# Patient Record
Sex: Male | Born: 1980 | Race: White | Hispanic: No | Marital: Single | Smoking: Never smoker
Health system: Southern US, Community
[De-identification: ages and names within clinical notes are randomized; demographics above are authoritative.]

---

## 2007-05-07 ENCOUNTER — Emergency Department (HOSPITAL_COMMUNITY): Admission: EM | Admit: 2007-05-07 | Discharge: 2007-05-07 | Payer: Self-pay | Admitting: Family Medicine

## 2014-08-09 ENCOUNTER — Emergency Department (HOSPITAL_COMMUNITY): Payer: Self-pay

## 2014-08-09 ENCOUNTER — Observation Stay (HOSPITAL_COMMUNITY)
Admission: EM | Admit: 2014-08-09 | Discharge: 2014-08-12 | Disposition: A | Discharge status: Home / Self Care | Payer: Self-pay | Attending: General Surgery | Admitting: General Surgery

## 2014-08-09 ENCOUNTER — Encounter (HOSPITAL_COMMUNITY): Payer: Self-pay | Admitting: *Deleted

## 2014-08-09 DIAGNOSIS — K37 Unspecified appendicitis: Secondary | ICD-10-CM | POA: Diagnosis present

## 2014-08-09 DIAGNOSIS — K3580 Unspecified acute appendicitis: Principal | ICD-10-CM | POA: Insufficient documentation

## 2014-08-09 DIAGNOSIS — K219 Gastro-esophageal reflux disease without esophagitis: Secondary | ICD-10-CM | POA: Insufficient documentation

## 2014-08-09 LAB — COMPREHENSIVE METABOLIC PANEL
ALK PHOS: 88 U/L (ref 39–117)
ALT: 32 U/L (ref 0–53)
AST: 23 U/L (ref 0–37)
Albumin: 4.4 g/dL (ref 3.5–5.2)
Anion gap: 9 (ref 5–15)
BUN: 11 mg/dL (ref 6–23)
CHLORIDE: 103 mmol/L (ref 96–112)
CO2: 25 mmol/L (ref 19–32)
Calcium: 9.7 mg/dL (ref 8.4–10.5)
Creatinine, Ser: 0.99 mg/dL (ref 0.50–1.35)
GFR calc Af Amer: >90 mL/min (ref >90)
GLUCOSE: 109 mg/dL — AB (ref 70–99)
Potassium: 4.2 mmol/L (ref 3.5–5.1)
SODIUM: 137 mmol/L (ref 135–145)
Total Bilirubin: 0.6 mg/dL (ref 0.3–1.2)
Total Protein: 7.1 g/dL (ref 6.0–8.3)

## 2014-08-09 LAB — CBC WITH DIFFERENTIAL/PLATELET
Basophils Absolute: 0 10*3/uL (ref 0.0–0.1)
Basophils Relative: 0 % (ref 0–1)
EOS ABS: 0 10*3/uL (ref 0.0–0.7)
Eosinophils Relative: 0 % (ref 0–5)
HEMATOCRIT: 43.3 % (ref 39.0–52.0)
HEMOGLOBIN: 15.4 g/dL (ref 13.0–17.0)
LYMPHS PCT: 12 % (ref 12–46)
Lymphs Abs: 1.9 10*3/uL (ref 0.7–4.0)
MCH: 31.7 pg (ref 26.0–34.0)
MCHC: 35.6 g/dL (ref 30.0–36.0)
MCV: 89.1 fL (ref 78.0–100.0)
MONO ABS: 0.5 10*3/uL (ref 0.1–1.0)
MONOS PCT: 3 % (ref 3–12)
Neutro Abs: 13.1 10*3/uL — ABNORMAL HIGH (ref 1.7–7.7)
Neutrophils Relative %: 85 % — ABNORMAL HIGH (ref 43–77)
Platelets: 322 10*3/uL (ref 150–400)
RBC: 4.86 MIL/uL (ref 4.22–5.81)
RDW: 12.5 % (ref 11.5–15.5)
WBC: 15.5 10*3/uL — AB (ref 4.0–10.5)

## 2014-08-09 LAB — LIPASE, BLOOD: Lipase: 29 U/L (ref 11–59)

## 2014-08-09 MED ORDER — IOHEXOL 300 MG/ML  SOLN
25.0000 mL | Freq: Once | INTRAMUSCULAR | Status: AC | PRN
  Administered 2014-08-09: 25 mL via ORAL

## 2014-08-09 MED ORDER — ONDANSETRON 4 MG PO TBDP
8.0000 mg | ORAL_TABLET | Freq: Once | ORAL | Status: AC
  Administered 2014-08-09: 8 mg via ORAL

## 2014-08-09 MED ORDER — ONDANSETRON 4 MG PO TBDP
ORAL_TABLET | ORAL | Status: AC
  Administered 2014-08-09: 22:00:00
  Filled 2014-08-09: qty 2

## 2014-08-09 MED ORDER — IOHEXOL 300 MG/ML  SOLN
100.0000 mL | Freq: Once | INTRAMUSCULAR | Status: AC | PRN
  Administered 2014-08-09: 100 mL via INTRAVENOUS

## 2014-08-09 MED ORDER — SODIUM CHLORIDE 0.9 % IV SOLN
3.0000 g | Freq: Once | INTRAVENOUS | Status: AC
  Administered 2014-08-09: 3 g via INTRAVENOUS
  Filled 2014-08-09: qty 3

## 2014-08-09 MED ORDER — OXYCODONE-ACETAMINOPHEN 5-325 MG PO TABS
1.0000 | ORAL_TABLET | Freq: Once | ORAL | Status: DC
Start: 2014-08-09 — End: 2014-08-10

## 2014-08-09 MED ORDER — PANTOPRAZOLE SODIUM 40 MG IV SOLR
40.0000 mg | Freq: Once | INTRAVENOUS | Status: AC
  Administered 2014-08-09: 40 mg via INTRAVENOUS
  Filled 2014-08-09: qty 40

## 2014-08-09 MED ORDER — ONDANSETRON HCL 4 MG/2ML IJ SOLN
4.0000 mg | Freq: Once | INTRAMUSCULAR | Status: AC
  Administered 2014-08-09: 4 mg via INTRAVENOUS
  Filled 2014-08-09: qty 2

## 2014-08-09 MED ORDER — HYDROMORPHONE HCL 1 MG/ML IJ SOLN
1.0000 mg | Freq: Once | INTRAMUSCULAR | Status: AC
  Administered 2014-08-09: 1 mg via INTRAVENOUS
  Filled 2014-08-09: qty 1

## 2014-08-09 MED ORDER — SODIUM CHLORIDE 0.9 % IV BOLUS (SEPSIS)
1000.0000 mL | Freq: Once | INTRAVENOUS | Status: AC
  Administered 2014-08-09: 1000 mL via INTRAVENOUS

## 2014-08-09 NOTE — ED Provider Notes (Signed)
This chart was scribed for Jeremiah DredgeEmily Tamanna Whitson, PA-C with Benjiman CoreNathan Pickering, MD by Tonye RoyaltyJoshua Chen, ED Scribe. This patient was seen in room TR01C/TR01C and the patient's care was started at 6:09 PM.   Patient seen in Pod F in attempt to order appropriate studies in order to expedite her care once she is seen in the Main ED.   Jeremiah ManisStephen Campanile is a 34 y.o. male who presents to the Emergency Department complaining of abdominal pain with onset this morning, worsening progressively. He locates it to epigastrium and describes it as "like a hot poker." He rates it at 10/10. He denies having similar symptoms previously. He denies injury. He reports associated vomiting x 2 and one episode of what may have been diarrhea.  He denies history of pancreatitis or gallbladder problem. He reports prior kidney stones and GERD.  He denies urinary symptoms but notes he has not urinated since this morning. Denies CP, SOB, cough.  He agrees with treatment plan, including CT of abdomen. Other workup and evaluation deferred to provider in Main ED. Pt to be moved back to the waiting room to await room available in main ED.    I personally performed the services described in this documentation, which was scribed in my presence. The recorded information has been reviewed and is accurate.   Jeremiah Dredgemily Anaika Santillano, PA-C 08/09/14 1855  Benjiman CoreNathan Pickering, MD 08/12/14 1250

## 2014-08-09 NOTE — H&P (Signed)
Chief Complaint:  Abdominal pain since this morning with early acute appendicitis on CT  History of Present Illness:  Jeremiah [name] is an 34 y.o. 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  He had midepigastric abdominal pain with nausea and vomiting and on CT scan has early appendicitis.  Seen in the ER and procedure explained and he will be admitted for IV antibiotics  History reviewed. No pertinent past medical history.  History reviewed. No pertinent past surgical history.  Current Facility-Administered Medications  Medication Dose Route Frequency Provider Last Rate Last Dose  . Ampicillin-Sulbactam (UNASYN) 3 g in sodium chloride 0.9 % 100 mL IVPB  3 g Intravenous Once Lajean Saver, MD 100 mL/hr at 08/09/14 2159 3 g at 08/09/14 2159  . oxyCODONE-acetaminophen (PERCOCET/ROXICET) 5-325 MG per tablet 1 tablet  1 tablet Oral Once Lolita, PA-C   Stopped at 08/09/14 1940   Current Outpatient Prescriptions  Medication Sig Dispense Refill  . Aspirin-Acetaminophen-Caffeine (GOODY HEADACHE PO) Take 1 Package by mouth every 6 (six) hours as needed (for pain).    Marland Kitchen omeprazole (PRILOSEC) 20 MG capsule Take 20 mg by mouth daily.     Bee venom History reviewed. No pertinent family history. Social History:   reports that he has never smoked. He has never used smokeless tobacco. He reports that he does not drink alcohol or use illicit drugs.   REVIEW OF SYSTEMS : Negative   Physical Exam:   Blood pressure 134/81, pulse 107, temperature 98 F (36.7 C), temperature source Oral, resp. rate 14, SpO2 97 %. There is no height or weight on file to calculate BMI.  Gen:  WDWN WM NAD -pain meds on board Neurological: Alert and oriented to person, place, and time. Motor and sensory function is grossly intact  Head: Normocephalic and atraumatic.  Eyes: Conjunctivae are normal. Pupils are equal, round, and reactive to light. No scleral icterus.  Neck: Normal range of  motion. Neck supple. No tracheal deviation or thyromegaly present.  Cardiovascular:  SR without murmurs or gallops.  No carotid bruits Breast:  Not examined Respiratory: Effort normal.  No respiratory distress. No chest wall tenderness. Breath sounds normal.  No wheezes, rales or rhonchi.  Abdomen:  Tender in the midepigastrium and right lower quadrantr GU:  unremarkable Musculoskeletal: Normal range of motion. Extremities are nontender. No cyanosis, edema or clubbing noted Lymphadenopathy: No cervical, preauricular, postauricular or axillary adenopathy is present Skin: Skin is warm and dry. No rash noted. No diaphoresis. No erythema. No pallor. Pscyh: Normal mood and affect. Behavior is normal. Judgment and thought content normal.   LABORATORY RESULTS: Results for orders placed or performed during the hospital encounter of 08/09/14 (from the past 48 hour(s))  CBC with Differential     Status: Abnormal   Collection Time: 08/09/14  4:50 PM  Result Value Ref Range   WBC 15.5 (H) 4.0 - 10.5 K/uL   RBC 4.86 4.22 - 5.81 MIL/uL   Hemoglobin 15.4 13.0 - 17.0 g/dL   HCT 43.3 39.0 - 52.0 %   MCV 89.1 78.0 - 100.0 fL   MCH 31.7 26.0 - 34.0 pg   MCHC 35.6 30.0 - 36.0 g/dL   RDW 12.5 11.5 - 15.5 %   Platelets 322 150 - 400 K/uL   Neutrophils Relative % 85 (H) 43 - 77 %   Neutro Abs 13.1 (H) 1.7 - 7.7 K/uL   Lymphocytes Relative 12 12 - 46 %   Lymphs Abs 1.9  0.7 - 4.0 K/uL   Monocytes Relative 3 3 - 12 %   Monocytes Absolute 0.5 0.1 - 1.0 K/uL   Eosinophils Relative 0 0 - 5 %   Eosinophils Absolute 0.0 0.0 - 0.7 K/uL   Basophils Relative 0 0 - 1 %   Basophils Absolute 0.0 0.0 - 0.1 K/uL  Comprehensive metabolic panel     Status: Abnormal   Collection Time: 08/09/14  4:50 PM  Result Value Ref Range   Sodium 137 135 - 145 mmol/L   Potassium 4.2 3.5 - 5.1 mmol/L   Chloride 103 96 - 112 mmol/L   CO2 25 19 - 32 mmol/L   Glucose, Bld 109 (H) 70 - 99 mg/dL   BUN 11 6 - 23 mg/dL   Creatinine,  Ser 0.99 0.50 - 1.35 mg/dL   Calcium 9.7 8.4 - 10.5 mg/dL   Total Protein 7.1 6.0 - 8.3 g/dL   Albumin 4.4 3.5 - 5.2 g/dL   AST 23 0 - 37 U/L   ALT 32 0 - 53 U/L   Alkaline Phosphatase 88 39 - 117 U/L   Total Bilirubin 0.6 0.3 - 1.2 mg/dL   GFR calc non Af Amer >90 >90 mL/min   GFR calc Af Amer >90 >90 mL/min    Comment: (NOTE) The eGFR has been calculated using the CKD EPI equation. This calculation has not been validated in all clinical situations. eGFR's persistently <90 mL/min signify possible Chronic Kidney Disease.    Anion gap 9 5 - 15  Lipase, blood     Status: None   Collection Time: 08/09/14  4:50 PM  Result Value Ref Range   Lipase 29 11 - 59 U/L     RADIOLOGY RESULTS: Ct Abdomen Pelvis W Contrast  08/09/2014   CLINICAL DATA:  Epigastric pain with nausea. Symptoms since this morning, progressive.  EXAM: CT ABDOMEN AND PELVIS WITH CONTRAST  TECHNIQUE: Multidetector CT imaging of the abdomen and pelvis was performed using the standard protocol following bolus administration of intravenous contrast.  CONTRAST:  145m OMNIPAQUE IOHEXOL 300 MG/ML  SOLN  COMPARISON:  None.  FINDINGS: The included lung bases are clear.  The appendix is abnormal measuring 14 mm proximally and contains intraluminal debris. There is air in the midportion of the appendix. Distally the appendix is fluid-filled and measures 10 mm. There is no definite surrounding periappendiceal inflammatory change. Findings are suspicious for very early acute appendicitis. There is no perforation or abscess.  The liver, gallbladder, spleen, adrenal glands, and pancreas are normal. Small splenule noted at the splenic hilum. There is a small cyst in the right kidney measuring 7 mm. Kidneys are otherwise normal. There is symmetric renal enhancement without hydronephrosis or perinephric stranding.  The stomach is physiologically distended. There are no dilated or thickened bowel loops. The terminal ileum is normal. Small volume  of stool in the proximal colon, distal colon is decompressed. There is no pericolonic inflammatory change.  Abdominal aorta is normal in caliber. No retroperitoneal adenopathy.  Within the pelvis the bladder is physiologically distended. Prostate gland is normal. There is no pelvic adenopathy. No pelvic free fluid. Minimal fat in the right inguinal canal.  There are no acute or suspicious osseous abnormalities.  IMPRESSION: Dilated proximal appendix (123m containing intraluminal debris, with fluid distending the distal appendix. While there is no surrounding inflammatory change, findings are suspicious for very early acute appendicitis.   Electronically Signed   By: MeJeb Levering.D.   On: 08/09/2014 21:26  Problem List: Patient Active Problem List   Diagnosis Date Noted  . Appendicitis, acute 08/09/2014    Assessment & Plan: Acute appendicitis--Start Zosyn and plan lap appy in am    Matt B. Hassell Done, MD, [facility] [phone] beeper [phone]  08/09/2014 10:55 PM

## 2014-08-09 NOTE — ED Notes (Signed)
Pt started c/o epigastric pain and nausea again, MD to be notified.

## 2014-08-09 NOTE — ED Provider Notes (Signed)
CSN: [id number]     Arrival date & time 08/09/14  1637 History   First MD Initiated Contact with Patient 08/09/14 1802     Chief Complaint  Patient presents with  . Abdominal Pain     (Consider location/radiation/quality/duration/timing/severity/associated sxs/prior Treatment) Patient is a 34 y.o. male presenting with abdominal pain. The history is provided by the patient.  Abdominal Pain Associated symptoms: nausea and vomiting   Associated symptoms: no chest pain, no chills, no constipation, no cough, no diarrhea, no dysuria, no fever, no hematuria, no shortness of breath and no sore throat   pt c/o epigastric and mid abd pain onset this morning. Pain constant, dull, mod-severe, non radiating. No specific exacerbating or alleviating factors. No fever or chills. +n/v x 2, emesis not bloody or bilious. Had normal bm today. No abd distension. No prior abd surgery. No hx similar pain. No hx pud, gallstones or pancreatitis. No dysuria, hematuria or gu c/o. No back or flank pain. No scrotal or testicular pain. No cough or uri c/o. No abd trauma.      History reviewed. No pertinent past medical history. History reviewed. No pertinent past surgical history. History reviewed. No pertinent family history. History  Substance Use Topics  . Smoking status: Never Smoker   . Smokeless tobacco: Never Used  . Alcohol Use: No    Review of Systems  Constitutional: Negative for fever and chills.  HENT: Negative for sore throat.   Eyes: Negative for redness.  Respiratory: Negative for cough and shortness of breath.   Cardiovascular: Negative for chest pain.  Gastrointestinal: Positive for nausea, vomiting and abdominal pain. Negative for diarrhea and constipation.  Endocrine: Negative for polyuria.  Genitourinary: Negative for dysuria, hematuria and flank pain.  Musculoskeletal: Negative for back pain and neck pain.  Skin: Negative for rash.  Neurological: Negative for headaches.   Hematological: Does not bruise/bleed easily.  Psychiatric/Behavioral: Negative for confusion.      Allergies  Review of patient's allergies indicates no known allergies.  Home Medications   Prior to Admission medications   Not on File   BP 143/92 mmHg  Pulse 79  Temp(Src) 98 F (36.7 C) (Oral)  Resp 20  SpO2 100% Physical Exam  Constitutional: He is oriented to person, place, and time. He appears well-developed and well-nourished. No distress.  HENT:  Head: Atraumatic.  Eyes: Pupils are equal, round, and reactive to light. No scleral icterus.  Neck: Neck supple. No tracheal deviation present.  Cardiovascular: Normal rate, regular rhythm, normal heart sounds and intact distal pulses.  Exam reveals no gallop and no friction rub.   No murmur heard. Pulmonary/Chest: Effort normal and breath sounds normal. No accessory muscle usage. No respiratory distress.  Abdominal: Soft. Bowel sounds are normal. He exhibits no distension and no mass. There is tenderness. There is no rebound and no guarding.  Moderate mid abd tenderness. No hernia.   Genitourinary:  No cva tenderness  Musculoskeletal: Normal range of motion.  Neurological: He is alert and oriented to person, place, and time.  Skin: Skin is warm and dry. No rash noted.  Psychiatric: He has a normal mood and affect.  Nursing note and vitals reviewed.   ED Course  Procedures (including critical care time) Labs Review  Results for orders placed or performed during the hospital encounter of 08/09/14  CBC with Differential  Result Value Ref Range   WBC 15.5 (H) 4.0 - 10.5 K/uL   RBC 4.86 4.22 - 5.81 MIL/uL   Hemoglobin  15.4 13.0 - 17.0 g/dL   HCT 16.1 09.6 - 04.5 %   MCV 89.1 78.0 - 100.0 fL   MCH 31.7 26.0 - 34.0 pg   MCHC 35.6 30.0 - 36.0 g/dL   RDW 40.9 81.1 - 91.4 %   Platelets 322 150 - 400 K/uL   Neutrophils Relative % 85 (H) 43 - 77 %   Neutro Abs 13.1 (H) 1.7 - 7.7 K/uL   Lymphocytes Relative 12 12 - 46 %    Lymphs Abs 1.9 0.7 - 4.0 K/uL   Monocytes Relative 3 3 - 12 %   Monocytes Absolute 0.5 0.1 - 1.0 K/uL   Eosinophils Relative 0 0 - 5 %   Eosinophils Absolute 0.0 0.0 - 0.7 K/uL   Basophils Relative 0 0 - 1 %   Basophils Absolute 0.0 0.0 - 0.1 K/uL  Comprehensive metabolic panel  Result Value Ref Range   Sodium 137 135 - 145 mmol/L   Potassium 4.2 3.5 - 5.1 mmol/L   Chloride 103 96 - 112 mmol/L   CO2 25 19 - 32 mmol/L   Glucose, Bld 109 (H) 70 - 99 mg/dL   BUN 11 6 - 23 mg/dL   Creatinine, Ser 7.82 0.50 - 1.35 mg/dL   Calcium 9.7 8.4 - 95.6 mg/dL   Total Protein 7.1 6.0 - 8.3 g/dL   Albumin 4.4 3.5 - 5.2 g/dL   AST 23 0 - 37 U/L   ALT 32 0 - 53 U/L   Alkaline Phosphatase 88 39 - 117 U/L   Total Bilirubin 0.6 0.3 - 1.2 mg/dL   GFR calc non Af Amer >90 >90 mL/min   GFR calc Af Amer >90 >90 mL/min   Anion gap 9 5 - 15  Lipase, blood  Result Value Ref Range   Lipase 29 11 - 59 U/L   Ct Abdomen Pelvis W Contrast  08/09/2014   CLINICAL DATA:  Epigastric pain with nausea. Symptoms since this morning, progressive.  EXAM: CT ABDOMEN AND PELVIS WITH CONTRAST  TECHNIQUE: Multidetector CT imaging of the abdomen and pelvis was performed using the standard protocol following bolus administration of intravenous contrast.  CONTRAST:  OMNIPAQUE IOHEXOL 300 MG/ML  SOLN  COMPARISON:  None.  FINDINGS: The included lung bases are clear.  The appendix is abnormal measuring 14 mm proximally and contains intraluminal debris. There is air in the midportion of the appendix. Distally the appendix is fluid-filled and measures 10 mm. There is no definite surrounding periappendiceal inflammatory change. Findings are suspicious for very early acute appendicitis. There is no perforation or abscess.  The liver, gallbladder, spleen, adrenal glands, and pancreas are normal. Small splenule noted at the splenic hilum. There is a small cyst in the right kidney measuring 7 mm. Kidneys are otherwise normal. There is  symmetric renal enhancement without hydronephrosis or perinephric stranding.  The stomach is physiologically distended. There are no dilated or thickened bowel loops. The terminal ileum is normal. Small volume of stool in the proximal colon, distal colon is decompressed. There is no pericolonic inflammatory change.  Abdominal aorta is normal in caliber. No retroperitoneal adenopathy.  Within the pelvis the bladder is physiologically distended. Prostate gland is normal. There is no pelvic adenopathy. No pelvic free fluid. Minimal fat in the right inguinal canal.  There are no acute or suspicious osseous abnormalities.  IMPRESSION: Dilated proximal appendix (14mm) containing intraluminal debris, with fluid distending the distal appendix. While there is no surrounding inflammatory change, findings are suspicious  for very early acute appendicitis.   Electronically Signed   By: Rubye OaksMelanie  Ehinger M.D.   On: 08/09/2014 21:26       MDM   Iv ns bolus.   Labs. Ct.  Dilaudid 1 mg iv. zofran iv. protonix iv.  Reviewed nursing notes and prior charts for additional history.   2130 ct result noted - call placed to general surgeon - discussed w gen surgeon on call, who is in OR currently - he will see as soon as done.   unasyn iv.   Recheck pt more comfortable, informed of ct.   Recheck abd mid to right lower abd pain, no rebound/guarding.  Awaiting surgery/OR.     Cathren LaineKevin Elio Haden, MD 08/09/14 2139

## 2014-08-09 NOTE — ED Notes (Signed)
Pt reports mid abdominal pain since this morning associated with n/v.  Pt reports normal BM today, denies any urinary symptoms. Pt reports some relief with vomiting. Pt denies being around any body sick.

## 2014-08-09 NOTE — ED Notes (Signed)
Patient transported to CT 

## 2014-08-10 ENCOUNTER — Observation Stay (HOSPITAL_COMMUNITY): Payer: MEDICAID | Admitting: Anesthesiology

## 2014-08-10 ENCOUNTER — Encounter (HOSPITAL_COMMUNITY)
Admission: EM | Disposition: A | Discharge status: Home / Self Care | Payer: Self-pay | Source: Home / Self Care | Attending: Emergency Medicine

## 2014-08-10 ENCOUNTER — Observation Stay (HOSPITAL_COMMUNITY): Payer: Self-pay | Admitting: Anesthesiology

## 2014-08-10 ENCOUNTER — Encounter (HOSPITAL_COMMUNITY): Payer: Self-pay | Admitting: Certified Registered Nurse Anesthetist

## 2014-08-10 HISTORY — PX: LAPAROSCOPIC APPENDECTOMY: SHX408

## 2014-08-10 LAB — CBC
HCT: 40.3 % (ref 39.0–52.0)
HEMATOCRIT: 40.9 % (ref 39.0–52.0)
Hemoglobin: 14 g/dL (ref 13.0–17.0)
Hemoglobin: 14 g/dL (ref 13.0–17.0)
MCH: 31.3 pg (ref 26.0–34.0)
MCH: 30.8 pg (ref 26.0–34.0)
MCHC: 34.2 g/dL (ref 30.0–36.0)
MCHC: 34.7 g/dL (ref 30.0–36.0)
MCV: 90.1 fL (ref 78.0–100.0)
MCV: 90.2 fL (ref 78.0–100.0)
PLATELETS: 286 10*3/uL (ref 150–400)
Platelets: 293 10*3/uL (ref 150–400)
RBC: 4.47 MIL/uL (ref 4.22–5.81)
RBC: 4.54 MIL/uL (ref 4.22–5.81)
RDW: 12.6 % (ref 11.5–15.5)
RDW: 12.6 % (ref 11.5–15.5)
WBC: 20.9 10*3/uL — ABNORMAL HIGH (ref 4.0–10.5)
WBC: 21 10*3/uL — ABNORMAL HIGH (ref 4.0–10.5)

## 2014-08-10 LAB — CREATININE, SERUM
Creatinine, Ser: 1.03 mg/dL (ref 0.50–1.35)
GFR calc Af Amer: >90 mL/min (ref >90)
GFR calc non Af Amer: >90 mL/min (ref >90)

## 2014-08-10 LAB — SURGICAL PCR SCREEN
MRSA, PCR: NEGATIVE
STAPHYLOCOCCUS AUREUS: NEGATIVE

## 2014-08-10 SURGERY — APPENDECTOMY, LAPAROSCOPIC
Anesthesia: General | Site: Abdomen

## 2014-08-10 MED ORDER — OXYCODONE-ACETAMINOPHEN 5-325 MG PO TABS
1.0000 | ORAL_TABLET | ORAL | Status: DC | PRN
Start: 2014-08-10 — End: 2014-08-12
  Administered 2014-08-11 – 2014-08-12 (×4): 2 via ORAL
  Filled 2014-08-10 (×5): qty 2

## 2014-08-10 MED ORDER — BUPIVACAINE-EPINEPHRINE (PF) 0.25% -1:200000 IJ SOLN
INTRAMUSCULAR | Status: AC
  Filled 2014-08-10: qty 30

## 2014-08-10 MED ORDER — MORPHINE SULFATE 2 MG/ML IJ SOLN
1.0000 mg | INTRAMUSCULAR | Status: DC | PRN
  Administered 2014-08-10 (×4): 1 mg via INTRAVENOUS
  Filled 2014-08-10 (×4): qty 1

## 2014-08-10 MED ORDER — LIDOCAINE HCL (CARDIAC) 20 MG/ML IV SOLN
INTRAVENOUS | Status: AC
  Filled 2014-08-10: qty 5

## 2014-08-10 MED ORDER — OXYCODONE-ACETAMINOPHEN 5-325 MG PO TABS: 1.0000 | ORAL_TABLET | ORAL | Status: DC | PRN

## 2014-08-10 MED ORDER — 0.9 % SODIUM CHLORIDE (POUR BTL) OPTIME
TOPICAL | Status: DC | PRN
  Administered 2014-08-10: 1000 mL

## 2014-08-10 MED ORDER — KETOROLAC TROMETHAMINE 30 MG/ML IJ SOLN
30.0000 mg | Freq: Once | INTRAMUSCULAR | Status: AC | PRN
  Administered 2014-08-10: 30 mg via INTRAVENOUS

## 2014-08-10 MED ORDER — LACTATED RINGERS IV SOLN
INTRAVENOUS | Status: DC | PRN
  Administered 2014-08-10 (×2): via INTRAVENOUS

## 2014-08-10 MED ORDER — SUCCINYLCHOLINE CHLORIDE 20 MG/ML IJ SOLN
INTRAMUSCULAR | Status: DC | PRN
  Administered 2014-08-10: 120 mg via INTRAVENOUS

## 2014-08-10 MED ORDER — ONDANSETRON HCL 4 MG/2ML IJ SOLN
INTRAMUSCULAR | Status: DC | PRN
  Administered 2014-08-10: 4 mg via INTRAVENOUS

## 2014-08-10 MED ORDER — DIPHENHYDRAMINE HCL 12.5 MG/5ML PO ELIX: 12.5000 mg | ORAL_SOLUTION | Freq: Four times a day (QID) | ORAL | Status: DC | PRN

## 2014-08-10 MED ORDER — SUCCINYLCHOLINE CHLORIDE 20 MG/ML IJ SOLN
INTRAMUSCULAR | Status: AC
  Filled 2014-08-10: qty 1

## 2014-08-10 MED ORDER — PROMETHAZINE HCL 25 MG/ML IJ SOLN: 6.2500 mg | INTRAMUSCULAR | Status: DC | PRN

## 2014-08-10 MED ORDER — GLYCOPYRROLATE 0.2 MG/ML IJ SOLN
INTRAMUSCULAR | Status: AC
  Filled 2014-08-10: qty 3

## 2014-08-10 MED ORDER — ROCURONIUM BROMIDE 50 MG/5ML IV SOLN
INTRAVENOUS | Status: AC
  Filled 2014-08-10: qty 1

## 2014-08-10 MED ORDER — MIDAZOLAM HCL 2 MG/2ML IJ SOLN
INTRAMUSCULAR | Status: AC
Start: 2014-08-10 — End: 2014-08-10
  Filled 2014-08-10: qty 2

## 2014-08-10 MED ORDER — KETOROLAC TROMETHAMINE 30 MG/ML IJ SOLN
INTRAMUSCULAR | Status: AC
  Filled 2014-08-10: qty 1

## 2014-08-10 MED ORDER — ALUM & MAG HYDROXIDE-SIMETH 200-200-20 MG/5ML PO SUSP
30.0000 mL | Freq: Four times a day (QID) | ORAL | Status: DC | PRN
  Administered 2014-08-11 (×2): 30 mL via ORAL
  Filled 2014-08-10 (×3): qty 30

## 2014-08-10 MED ORDER — ROCURONIUM BROMIDE 100 MG/10ML IV SOLN
INTRAVENOUS | Status: DC | PRN
  Administered 2014-08-10: 40 mg via INTRAVENOUS

## 2014-08-10 MED ORDER — MIDAZOLAM HCL 5 MG/5ML IJ SOLN
INTRAMUSCULAR | Status: DC | PRN
  Administered 2014-08-10: 2 mg via INTRAVENOUS

## 2014-08-10 MED ORDER — PIPERACILLIN-TAZOBACTAM 3.375 G IVPB
3.3750 g | Freq: Three times a day (TID) | INTRAVENOUS | Status: DC
  Administered 2014-08-10 – 2014-08-11 (×4): 3.375 g via INTRAVENOUS
  Filled 2014-08-10 (×6): qty 50

## 2014-08-10 MED ORDER — PANTOPRAZOLE SODIUM 40 MG PO TBEC
40.0000 mg | DELAYED_RELEASE_TABLET | Freq: Every day | ORAL | Status: DC
  Administered 2014-08-11 – 2014-08-12 (×3): 40 mg via ORAL
  Filled 2014-08-10 (×3): qty 1

## 2014-08-10 MED ORDER — LACTATED RINGERS IV SOLN
INTRAVENOUS | Status: DC
  Administered 2014-08-10: 50 mL/h via INTRAVENOUS
  Administered 2014-08-11: 02:00:00 via INTRAVENOUS

## 2014-08-10 MED ORDER — LIDOCAINE HCL (CARDIAC) 20 MG/ML IV SOLN
INTRAVENOUS | Status: DC | PRN
  Administered 2014-08-10: 60 mg via INTRAVENOUS

## 2014-08-10 MED ORDER — DIPHENHYDRAMINE HCL 50 MG/ML IJ SOLN: 12.5000 mg | Freq: Four times a day (QID) | INTRAMUSCULAR | Status: DC | PRN

## 2014-08-10 MED ORDER — SODIUM CHLORIDE 0.9 % IR SOLN
Status: DC | PRN
  Administered 2014-08-10: 1000 mL

## 2014-08-10 MED ORDER — HYDROMORPHONE HCL 1 MG/ML IJ SOLN
INTRAMUSCULAR | Status: AC
  Filled 2014-08-10: qty 1

## 2014-08-10 MED ORDER — ONDANSETRON HCL 4 MG/2ML IJ SOLN
INTRAMUSCULAR | Status: AC
  Filled 2014-08-10: qty 2

## 2014-08-10 MED ORDER — BUPIVACAINE-EPINEPHRINE 0.5% -1:200000 IJ SOLN
INTRAMUSCULAR | Status: DC | PRN
  Administered 2014-08-10: 6 mL

## 2014-08-10 MED ORDER — HYDROMORPHONE HCL 1 MG/ML IJ SOLN: 0.2500 mg | INTRAMUSCULAR | Status: DC | PRN

## 2014-08-10 MED ORDER — FENTANYL CITRATE 0.05 MG/ML IJ SOLN
INTRAMUSCULAR | Status: AC
  Filled 2014-08-10: qty 5

## 2014-08-10 MED ORDER — GLYCOPYRROLATE 0.2 MG/ML IJ SOLN
INTRAMUSCULAR | Status: DC | PRN
  Administered 2014-08-10: 0.4 mg via INTRAVENOUS

## 2014-08-10 MED ORDER — HEPARIN SODIUM (PORCINE) 5000 UNIT/ML IJ SOLN
5000.0000 [IU] | Freq: Three times a day (TID) | INTRAMUSCULAR | Status: DC
  Administered 2014-08-10 – 2014-08-12 (×5): 5000 [IU] via SUBCUTANEOUS
  Filled 2014-08-10 (×5): qty 1

## 2014-08-10 MED ORDER — PROPOFOL 10 MG/ML IV BOLUS
INTRAVENOUS | Status: AC
  Filled 2014-08-10: qty 20

## 2014-08-10 MED ORDER — HEMOSTATIC AGENTS (NO CHARGE) OPTIME
TOPICAL | Status: DC | PRN
  Administered 2014-08-10: 1 via TOPICAL

## 2014-08-10 MED ORDER — FENTANYL CITRATE 0.05 MG/ML IJ SOLN
INTRAMUSCULAR | Status: DC | PRN
  Administered 2014-08-10: 100 ug via INTRAVENOUS
  Administered 2014-08-10: 50 ug via INTRAVENOUS
  Administered 2014-08-10: 100 ug via INTRAVENOUS

## 2014-08-10 MED ORDER — NEOSTIGMINE METHYLSULFATE 10 MG/10ML IV SOLN
INTRAVENOUS | Status: DC | PRN
  Administered 2014-08-10: 3 mg via INTRAVENOUS

## 2014-08-10 MED ORDER — KCL IN DEXTROSE-NACL 20-5-0.45 MEQ/L-%-% IV SOLN
INTRAVENOUS | Status: DC
  Administered 2014-08-10 – 2014-08-11 (×2): via INTRAVENOUS
  Filled 2014-08-10 (×10): qty 1000

## 2014-08-10 MED ORDER — PROPOFOL 10 MG/ML IV BOLUS
INTRAVENOUS | Status: DC | PRN
  Administered 2014-08-10: 200 mg via INTRAVENOUS

## 2014-08-10 MED ORDER — NEOSTIGMINE METHYLSULFATE 10 MG/10ML IV SOLN
INTRAVENOUS | Status: AC
  Filled 2014-08-10: qty 3

## 2014-08-10 MED ORDER — HYDROMORPHONE HCL 1 MG/ML IJ SOLN
1.0000 mg | INTRAMUSCULAR | Status: DC | PRN
  Administered 2014-08-10 – 2014-08-11 (×8): 1 mg via INTRAVENOUS
  Filled 2014-08-10 (×8): qty 1

## 2014-08-10 MED ORDER — ONDANSETRON HCL 4 MG/2ML IJ SOLN
4.0000 mg | Freq: Four times a day (QID) | INTRAMUSCULAR | Status: DC | PRN
  Administered 2014-08-10 – 2014-08-11 (×2): 4 mg via INTRAVENOUS
  Filled 2014-08-10 (×3): qty 2

## 2014-08-10 MED ORDER — METOPROLOL TARTRATE 1 MG/ML IV SOLN
INTRAVENOUS | Status: DC | PRN
  Administered 2014-08-10: 1 mg via INTRAVENOUS

## 2014-08-10 SURGICAL SUPPLY — 46 items
APPLIER CLIP 5 13 M/L LIGAMAX5 (MISCELLANEOUS) ×3
APPLIER CLIP ROT 10 11.4 M/L (STAPLE)
BLADE SURG ROTATE 9660 (MISCELLANEOUS) IMPLANT
CANISTER SUCTION 2500CC (MISCELLANEOUS) ×3 IMPLANT
CHLORAPREP W/TINT 26ML (MISCELLANEOUS) ×3 IMPLANT
CLIP APPLIE 5 13 M/L LIGAMAX5 (MISCELLANEOUS) ×1 IMPLANT
CLIP APPLIE ROT 10 11.4 M/L (STAPLE) IMPLANT
COVER SURGICAL LIGHT HANDLE (MISCELLANEOUS) ×3 IMPLANT
CUTTER FLEX LINEAR 45M (STAPLE) ×3 IMPLANT
DRAPE LAPAROSCOPIC ABDOMINAL (DRAPES) ×3 IMPLANT
DRAPE WARM FLUID 44X44 (DRAPE) ×3 IMPLANT
ELECT REM PT RETURN 9FT ADLT (ELECTROSURGICAL) ×3
ELECTRODE REM PT RTRN 9FT ADLT (ELECTROSURGICAL) ×1 IMPLANT
ENDOLOOP SUT PDS II  0 18 (SUTURE)
ENDOLOOP SUT PDS II 0 18 (SUTURE) IMPLANT
GLOVE BIO SURGEON STRL SZ8 (GLOVE) ×3 IMPLANT
GLOVE BIOGEL PI IND STRL 6.5 (GLOVE) ×1 IMPLANT
GLOVE BIOGEL PI IND STRL 8 (GLOVE) ×1 IMPLANT
GLOVE BIOGEL PI INDICATOR 6.5 (GLOVE) ×2
GLOVE BIOGEL PI INDICATOR 8 (GLOVE) ×2
GLOVE BIOGEL PI ORTHO PRO SZ7 (GLOVE) ×2
GLOVE PI ORTHO PRO STRL SZ7 (GLOVE) ×1 IMPLANT
GLOVE SURG SS PI 6.5 STRL IVOR (GLOVE) ×3 IMPLANT
GOWN STRL REUS W/ TWL LRG LVL3 (GOWN DISPOSABLE) ×2 IMPLANT
GOWN STRL REUS W/ TWL XL LVL3 (GOWN DISPOSABLE) ×1 IMPLANT
GOWN STRL REUS W/TWL LRG LVL3 (GOWN DISPOSABLE) ×4
GOWN STRL REUS W/TWL XL LVL3 (GOWN DISPOSABLE) ×2
HEMOSTAT SNOW SURGICEL 2X4 (HEMOSTASIS) ×3 IMPLANT
KIT BASIN OR (CUSTOM PROCEDURE TRAY) ×3 IMPLANT
KIT ROOM TURNOVER OR (KITS) ×3 IMPLANT
LIQUID BAND (GAUZE/BANDAGES/DRESSINGS) ×3 IMPLANT
NS IRRIG 1000ML POUR BTL (IV SOLUTION) ×3 IMPLANT
PAD ARMBOARD 7.5X6 YLW CONV (MISCELLANEOUS) ×6 IMPLANT
POUCH SPECIMEN RETRIEVAL 10MM (ENDOMECHANICALS) ×3 IMPLANT
RELOAD STAPLE TA45 3.5 REG BLU (ENDOMECHANICALS) ×6 IMPLANT
SCALPEL HARMONIC ACE (MISCELLANEOUS) ×3 IMPLANT
SET IRRIG TUBING LAPAROSCOPIC (IRRIGATION / IRRIGATOR) ×3 IMPLANT
SPECIMEN JAR SMALL (MISCELLANEOUS) ×3 IMPLANT
SUT MON AB 4-0 PC3 18 (SUTURE) ×3 IMPLANT
TOWEL OR 17X24 6PK STRL BLUE (TOWEL DISPOSABLE) ×3 IMPLANT
TOWEL OR 17X26 10 PK STRL BLUE (TOWEL DISPOSABLE) ×3 IMPLANT
TRAY FOLEY CATH 16FR SILVER (SET/KITS/TRAYS/PACK) ×3 IMPLANT
TRAY LAPAROSCOPIC (CUSTOM PROCEDURE TRAY) ×3 IMPLANT
TROCAR XCEL BLADELESS 5X75MML (TROCAR) ×6 IMPLANT
TROCAR XCEL BLUNT TIP 100MML (ENDOMECHANICALS) ×3 IMPLANT
TUBING INSUFFLATION (TUBING) ×3 IMPLANT

## 2014-08-10 NOTE — Anesthesia Procedure Notes (Signed)
Procedure Name: Intubation Performed by: Kizzie FantasiaARVER, Aayliah Rotenberry J Pre-anesthesia Checklist: Emergency Drugs available, Patient identified, Suction available, Timeout performed and Patient being monitored Patient Re-evaluated:Patient Re-evaluated prior to inductionOxygen Delivery Method: Circle system utilized Preoxygenation: Pre-oxygenation with 100% oxygen Intubation Type: IV induction and Cricoid Pressure applied Ventilation: Mask ventilation without difficulty Laryngoscope Size: Mac and 3 Grade View: Grade II Tube type: Oral Tube size: 7.5 mm Number of attempts: 1 Airway Equipment and Method: Bite block Placement Confirmation: ETT inserted through vocal cords under direct vision,  breath sounds checked- equal and bilateral,  positive ETCO2 and CO2 detector Secured at: 23 cm Tube secured with: Tape

## 2014-08-10 NOTE — Transfer of Care (Signed)
Immediate Anesthesia Transfer of Care Note  Patient: Jeremiah ManisStephen Sautter  Procedure(s) Performed: Procedure(s): APPENDECTOMY LAPAROSCOPIC (N/A)  Patient Location: PACU  Anesthesia Type:General  Level of Consciousness: awake and alert   Airway & Oxygen Therapy: Patient Spontanous Breathing and Patient connected to nasal cannula oxygen  Post-op Assessment: Report given to RN and Post -op Vital signs reviewed and stable  Post vital signs: Reviewed and stable  Last Vitals:  Filed Vitals:   08/10/14 0845  BP: 116/76  Pulse: 96  Temp: 37.6 C  Resp: 20    Complications: No apparent anesthesia complications

## 2014-08-10 NOTE — Progress Notes (Signed)
Pt asked for nausea and heartburn medication.  Zofran not due until 0045. Patient's main complaint is heartburn. Patient takes 20mg  Prilosec daily at home.  Notified Dr. Janee Mornhompson.   Orders placed in chart.

## 2014-08-10 NOTE — Op Note (Signed)
Appendectomy, Lap, Procedure Note  Indications: The patient presented with a history of right-sided abdominal pain. A CT  revealed findings consistent with acute appendicitis. The procedure has been discussed with the patient.  Alternative therapies have been discussed with the patient.  Operative risks include bleeding,  Infection,  Organ injury,  Abscess, Nerve injury,  Blood vessel injury,  DVT,  Pulmonary embolism,  Death,  And possible reoperation.  Medical management risks include worsening of present situation.  The success of the procedure is 50 -90 % at treating patients symptoms.  The patient understands and agrees to proceed.  Pre-operative Diagnosis: Acute appendicitis without mention of peritonitis  Post-operative Diagnosis: Acute appendicitis without mention of peritonitis  Surgeon: Shamonique Battiste A.   Assistants: Orson Slick RNFA   Anesthesia: General endotracheal anesthesia and Local anesthesia 0.25.% bupivacaine, with epinephrine  ASA Class: 2  Procedure Details  The patient was seen again in the Holding Room. The risks, benefits, complications, treatment options, and expected outcomes were discussed with the patient and/or family. The possibilities of reaction to medication, pulmonary aspiration, perforation of viscus, bleeding, recurrent infection, finding a normal appendix, the need for additional procedures, failure to diagnose a condition, and creating a complication requiring transfusion or operation were discussed. There was concurrence with the proposed plan and informed consent was obtained. The site of surgery was properly noted/marked. The patient was taken to Operating Room, identified as Nichael Ehly and the procedure verified as Appendectomy. A Time Out was held and the above information confirmed.  The patient was placed in the supine position and general anesthesia was induced, along with placement of orogastric tube, Venodyne boots, and a Foley catheter. The abdomen  was prepped and draped in a sterile fashion. A one centimeter infraumbilical incision was made and the peritoneal cavity was accessed using the OPEN  technique. The pneumoperitoneum was then established to steady pressure of 12 mmHg. A 12 mm port was placed through the umbilical incision. Additional 5 mm cannulas then placed in the left lower quadrant of the abdomen and half way between the umbilicus and pubic symphysis under direct vision. A careful evaluation of the entire abdomen was carried out. The patient was placed in Trendelenburg and left lateral decubitus position. The small intestines were retracted in the cephalad and left lateral direction away from the pelvis and right lower quadrant. The patient was found to have an enlarged and inflamed appendix that was extending into the pelvis. There was no evidence of perforation.  The appendix was carefully dissected. A window was made in the mesoappendix at the base of the appendix. A harmonic scalpel was used across the mesoappendix. The appendix was divided at its base using an endo-GIA stapler. Minimal appendiceal stump was left in place.  A 5 mm clip was also placed across the appendiceal artery after dividing it with Harmonic scalpel. Surggicel placed on the staple line due to oozing and this stopped this.  There was no evidence of bleeding, leakage, or complication after division of the appendix. Irrigation was also performed and irrigate suctioned from the abdomen as well.  The umbilical port site was closed using 0 vicryl pursestring sutures fashion at the level of the fascia. The trocar site skin wounds were closed using skin staples.  Instrument, sponge, and needle counts were correct at the conclusion of the case.   Findings: The appendix was found to be inflamed. There were signs of necrosis.  There was not perforation. There was not abscess formation.  Estimated Blood Loss:  Minimal         Drains: none         Total IV Fluids: 600  mL         Specimens: appendix         Complications:  None; patient tolerated the procedure well.         Disposition: PACU - hemodynamically stable.         Condition: stable

## 2014-08-10 NOTE — Anesthesia Preprocedure Evaluation (Addendum)
Anesthesia Evaluation  Patient identified by MRN, date of birth, ID band Patient awake    Reviewed: Allergy & Precautions, NPO status , Patient's Chart, lab work & pertinent test results  Airway Mallampati: II  TM Distance: >3 FB Neck ROM: Full    Dental no notable dental hx.    Pulmonary neg pulmonary ROS,  breath sounds clear to auscultation  Pulmonary exam normal       Cardiovascular negative cardio ROS  Rhythm:Regular Rate:Normal     Neuro/Psych negative neurological ROS  negative psych ROS   GI/Hepatic Neg liver ROS, GERD-  Medicated,  Endo/Other  negative endocrine ROS  Renal/GU negative Renal ROS  negative genitourinary   Musculoskeletal negative musculoskeletal ROS (+)   Abdominal   Peds negative pediatric ROS (+)  Hematology negative hematology ROS (+)   Anesthesia Other Findings   Reproductive/Obstetrics negative OB ROS                             Anesthesia Physical Anesthesia Plan  ASA: I  Anesthesia Plan: General   Post-op Pain Management:    Induction: Intravenous, Rapid sequence and Cricoid pressure planned  Airway Management Planned: Oral ETT  Additional Equipment:   Intra-op Plan:   Post-operative Plan: Extubation in OR  Informed Consent: I have reviewed the patients History and Physical, chart, labs and discussed the procedure including the risks, benefits and alternatives for the proposed anesthesia with the patient or authorized representative who has indicated his/her understanding and acceptance.   Dental advisory given  Plan Discussed with: CRNA  Anesthesia Plan Comments:       Anesthesia Quick Evaluation

## 2014-08-10 NOTE — Anesthesia Postprocedure Evaluation (Signed)
Anesthesia Post Note  Patient: Jeremiah ManisStephen [name]  Procedure(s) Performed: Procedure(s) (LRB): APPENDECTOMY LAPAROSCOPIC (N/A)  Anesthesia type: General  Patient location: PACU  Post pain: Pain level controlled  Post assessment: Post-op Vital signs reviewed  Last Vitals: BP 115/63 mmHg  Pulse 83  Temp(Src) 36.9 C (Oral)  Resp 16  Ht 5\' 11"  (1.803 m)  Wt 165 lb (74.844 kg)  BMI 23.02 kg/m2  SpO2 98%  Post vital signs: Reviewed  Level of consciousness: sedated  Complications: No apparent anesthesia complications

## 2014-08-10 NOTE — Progress Notes (Signed)
UR completed 

## 2014-08-10 NOTE — Progress Notes (Signed)
Patient ID: Jeremiah [name], male   DOB: 1980/08/21, 34 y.o.   MRN: [id number]     [facility]      [address] [place], [place] [address]    Phone: [phone] FAX: [phone]     Subjective: Continues to have pain.  Now more localized pelvic region.    Objective:  Vital signs:  Filed Vitals:   08/10/14 0015 08/10/14 0030 08/10/14 0108 08/10/14 0617  BP: 132/68 122/65 135/67 121/62  Pulse: 112 109 107 100  Temp:   100.5 F (38.1 C) 100 F (37.8 C)  TempSrc:   Oral Oral  Resp:      Height:   _0  (1.803 m)   Weight:   74.844 kg (165 lb)   SpO2: 93% 92% 97% 100%    Last BM Date: 08/09/14  Intake/Output   Yesterday:  04/13 0701 - 04/14 0700 In: 622.9 [I.V.:622.9] Out: 600 [Urine:600] This shift:    I/O last 3 completed shifts: In: 622.9 [I.V.:622.9] Out: 600 [Urine:600]    Physical Exam: General: Pt awake/alert/oriented x4 in no acute distress Abdomen: Soft.  Nondistended.  TTP lower abdomen, rebound tenderness.   No incarcerated hernias.   Problem List:   Active Problems:   Appendicitis, acute   Appendicitis    Results:   Labs: Results for orders placed or performed during the hospital encounter of 08/09/14 (from the past 48 hour(s))  CBC with Differential     Status: Abnormal   Collection Time: 08/09/14  4:50 PM  Result Value Ref Range   WBC 15.5 (H) 4.0 - 10.5 K/uL   RBC 4.86 4.22 - 5.81 MIL/uL   Hemoglobin 15.4 13.0 - 17.0 g/dL   HCT 43.3 39.0 - 52.0 %   MCV 89.1 78.0 - 100.0 fL   MCH 31.7 26.0 - 34.0 pg   MCHC 35.6 30.0 - 36.0 g/dL   RDW 12.5 11.5 - 15.5 %   Platelets 322 150 - 400 K/uL   Neutrophils Relative % 85 (H) 43 - 77 %   Neutro Abs 13.1 (H) 1.7 - 7.7 K/uL   Lymphocytes Relative 12 12 - 46 %   Lymphs Abs 1.9 0.7 - 4.0 K/uL   Monocytes Relative 3 3 - 12 %   Monocytes Absolute 0.5 0.1 - 1.0 K/uL   Eosinophils Relative 0 0 - 5 %   Eosinophils Absolute 0.0 0.0 - 0.7 K/uL   Basophils Relative 0 0 - 1 %   Basophils Absolute 0.0 0.0 - 0.1 K/uL  Comprehensive metabolic panel     Status: Abnormal   Collection Time: 08/09/14  4:50 PM  Result Value Ref Range   Sodium 137 135 - 145 mmol/L   Potassium 4.2 3.5 - 5.1 mmol/L   Chloride 103 96 - 112 mmol/L   CO2 25 19 - 32 mmol/L   Glucose, Bld 109 (H) 70 - 99 mg/dL   BUN 11 6 - 23 mg/dL   Creatinine, Ser 0.99 0.50 - 1.35 mg/dL   Calcium 9.7 8.4 - 10.5 mg/dL   Total Protein 7.1 6.0 - 8.3 g/dL   Albumin 4.4 3.5 - 5.2 g/dL   AST 23 0 - 37 U/L   ALT 32 0 - 53 U/L   Alkaline Phosphatase 88 39 - 117 U/L   Total Bilirubin 0.6 0.3 - 1.2 mg/dL   GFR calc non Af Amer >90 >90 mL/min   GFR calc Af Amer >90 >90 mL/min  Comment: (NOTE) The eGFR has been calculated using the CKD EPI equation. This calculation has not been validated in all clinical situations. eGFR's persistently <90 mL/min signify possible Chronic Kidney Disease.    Anion gap 9 5 - 15  Lipase, blood     Status: None   Collection Time: 08/09/14  4:50 PM  Result Value Ref Range   Lipase 29 11 - 59 U/L  Surgical pcr screen     Status: None   Collection Time: 08/10/14  1:26 AM  Result Value Ref Range   MRSA, PCR NEGATIVE NEGATIVE   Staphylococcus aureus NEGATIVE NEGATIVE    Comment:        The Xpert SA Assay (FDA approved for NASAL specimens in patients over 26 years of age), is one component of a comprehensive surveillance program.  Test performance has been validated by [facility] for patients greater than or equal to 7 year old. It is not intended to diagnose infection nor to guide or monitor treatment.   CBC     Status: Abnormal   Collection Time: 08/10/14  4:25 AM  Result Value Ref Range   WBC 21.0 (H) 4.0 - 10.5 K/uL   RBC 4.47 4.22 - 5.81 MIL/uL   Hemoglobin 14.0 13.0 - 17.0 g/dL   HCT 40.3 39.0 - 52.0 %   MCV 90.2 78.0 - 100.0 fL   MCH 31.3 26.0 - 34.0 pg   MCHC 34.7 30.0 - 36.0 g/dL   RDW 12.6 11.5 - 15.5 %   Platelets 286  150 - 400 K/uL  Creatinine, serum     Status: None   Collection Time: 08/10/14  4:25 AM  Result Value Ref Range   Creatinine, Ser 1.03 0.50 - 1.35 mg/dL   GFR calc non Af Amer >90 >90 mL/min   GFR calc Af Amer >90 >90 mL/min    Comment: (NOTE) The eGFR has been calculated using the CKD EPI equation. This calculation has not been validated in all clinical situations. eGFR's persistently <90 mL/min signify possible Chronic Kidney Disease.   CBC     Status: Abnormal   Collection Time: 08/10/14  4:25 AM  Result Value Ref Range   WBC 20.9 (H) 4.0 - 10.5 K/uL   RBC 4.54 4.22 - 5.81 MIL/uL   Hemoglobin 14.0 13.0 - 17.0 g/dL   HCT 40.9 39.0 - 52.0 %   MCV 90.1 78.0 - 100.0 fL   MCH 30.8 26.0 - 34.0 pg   MCHC 34.2 30.0 - 36.0 g/dL   RDW 12.6 11.5 - 15.5 %   Platelets 293 150 - 400 K/uL    Imaging / Studies: Ct Abdomen Pelvis W Contrast  08/09/2014   CLINICAL DATA:  Epigastric pain with nausea. Symptoms since this morning, progressive.  EXAM: CT ABDOMEN AND PELVIS WITH CONTRAST  TECHNIQUE: Multidetector CT imaging of the abdomen and pelvis was performed using the standard protocol following bolus administration of intravenous contrast.  CONTRAST:  167m OMNIPAQUE IOHEXOL 300 MG/ML  SOLN  COMPARISON:  None.  FINDINGS: The included lung bases are clear.  The appendix is abnormal measuring 14 mm proximally and contains intraluminal debris. There is air in the midportion of the appendix. Distally the appendix is fluid-filled and measures 10 mm. There is no definite surrounding periappendiceal inflammatory change. Findings are suspicious for very early acute appendicitis. There is no perforation or abscess.  The liver, gallbladder, spleen, adrenal glands, and pancreas are normal. Small splenule noted at the splenic hilum. There is a  small cyst in the right kidney measuring 7 mm. Kidneys are otherwise normal. There is symmetric renal enhancement without hydronephrosis or perinephric stranding.  The  stomach is physiologically distended. There are no dilated or thickened bowel loops. The terminal ileum is normal. Small volume of stool in the proximal colon, distal colon is decompressed. There is no pericolonic inflammatory change.  Abdominal aorta is normal in caliber. No retroperitoneal adenopathy.  Within the pelvis the bladder is physiologically distended. Prostate gland is normal. There is no pelvic adenopathy. No pelvic free fluid. Minimal fat in the right inguinal canal.  There are no acute or suspicious osseous abnormalities.  IMPRESSION: Dilated proximal appendix (15m) containing intraluminal debris, with fluid distending the distal appendix. While there is no surrounding inflammatory change, findings are suspicious for very early acute appendicitis.   Electronically Signed   By: MJeb LeveringM.D.   On: 08/09/2014 21:26    Medications / Allergies:  Scheduled Meds: . heparin  5,000 Units Subcutaneous 3 times per day  . piperacillin-tazobactam (ZOSYN)  IV  3.375 g Intravenous 3 times per day   Continuous Infusions: . dextrose 5 % and 0.45 % NaCl with KCl 20 mEq/L 125 mL/hr at 08/10/14 0141   PRN Meds:.diphenhydrAMINE **OR** diphenhydrAMINE, HYDROmorphone (DILAUDID) injection, oxyCODONE-acetaminophen  Antibiotics: Anti-infectives    Start     Dose/Rate Route Frequency Ordered Stop   08/10/14 0600  piperacillin-tazobactam (ZOSYN) IVPB 3.375 g     3.375 g 12.5 mL/hr over 240 Minutes Intravenous 3 times per day 08/10/14 0104     08/09/14 2200  Ampicillin-Sulbactam (UNASYN) 3 g in sodium chloride 0.9 % 100 mL IVPB     3 g 100 mL/hr over 60 Minutes Intravenous  Once 08/09/14 2131 08/09/14 2306        Assessment/Plan Acute appendicitis possible perforation  -To OR this AM -surgical risks discussed including but not limited to infection, bleeding, damage to surrounding structures and anesthesia risks.  The patient verbalizes understanding and wishes to proceed -change morphine  to dilaudid -IVF -NPO -Zosyn -SCD/heparin  EErby Pian ANP-BC [facility] Pager [phone](7A-4:30P) For consults and floor pages call [phone](7A-4:30P)  08/10/2014 8:38 AM

## 2014-08-11 LAB — CBC
HEMATOCRIT: 38.5 % — AB (ref 39.0–52.0)
HEMOGLOBIN: 12.9 g/dL — AB (ref 13.0–17.0)
MCH: 31.2 pg (ref 26.0–34.0)
MCHC: 33.5 g/dL (ref 30.0–36.0)
MCV: 93 fL (ref 78.0–100.0)
Platelets: 226 10*3/uL (ref 150–400)
RBC: 4.14 MIL/uL — ABNORMAL LOW (ref 4.22–5.81)
RDW: 13 % (ref 11.5–15.5)
WBC: 10.6 10*3/uL — AB (ref 4.0–10.5)

## 2014-08-11 MED ORDER — TAMSULOSIN HCL 0.4 MG PO CAPS
0.4000 mg | ORAL_CAPSULE | Freq: Every day | ORAL | Status: DC
  Administered 2014-08-11: 0.4 mg via ORAL
  Filled 2014-08-11 (×2): qty 1

## 2014-08-11 NOTE — Progress Notes (Signed)
1 Day Post-Op  Subjective: Queasy and can not pee has foley now  Objective: Vital signs in last 24 hours: Temp:  [97.7 F (36.5 C)-99.3 F (37.4 C)] 99.3 F (37.4 C) (04/15 0624) Pulse Rate:  [70-95] 89 (04/15 0624) Resp:  [13-19] 14 (04/15 0624) BP: (112-137)/(49-80) 123/76 mmHg (04/15 0624) SpO2:  [91 %-100 %] 97 % (04/15 0624) Last BM Date: 08/09/14  Intake/Output from previous day: 04/14 0701 - 04/15 0700 In: 2017.5 [I.V.:2017.5] Out: 950 [Urine:900; Blood:50] Intake/Output this shift:    Incision/Wound:CDI soft abdomen  Lab Results:   Recent Labs  08/10/14 0425 08/11/14 0442  WBC 20.9*  21.0* 10.6*  HGB 14.0  14.0 12.9*  HCT 40.9  40.3 38.5*  PLT 293  286 226   BMET  Recent Labs  08/09/14 1650 08/10/14 0425  NA 137  --   K 4.2  --   CL 103  --   CO2 25  --   GLUCOSE 109*  --   BUN 11  --   CREATININE 0.99 1.03  CALCIUM 9.7  --    PT/INR No results for input(s): LABPROT, INR in the last 72 hours. ABG No results for input(s): PHART, HCO3 in the last 72 hours.  Invalid input(s): PCO2, PO2  Studies/Results: Ct Abdomen Pelvis W Contrast  08/09/2014   CLINICAL DATA:  Epigastric pain with nausea. Symptoms since this morning, progressive.  EXAM: CT ABDOMEN AND PELVIS WITH CONTRAST  TECHNIQUE: Multidetector CT imaging of the abdomen and pelvis was performed using the standard protocol following bolus administration of intravenous contrast.  CONTRAST:  OMNIPAQUE IOHEXOL 300 MG/ML  SOLN  COMPARISON:  None.  FINDINGS: The included lung bases are clear.  The appendix is abnormal measuring 14 mm proximally and contains intraluminal debris. There is air in the midportion of the appendix. Distally the appendix is fluid-filled and measures 10 mm. There is no definite surrounding periappendiceal inflammatory change. Findings are suspicious for very early acute appendicitis. There is no perforation or abscess.  The liver, gallbladder, spleen, adrenal glands,  and pancreas are normal. Small splenule noted at the splenic hilum. There is a small cyst in the right kidney measuring 7 mm. Kidneys are otherwise normal. There is symmetric renal enhancement without hydronephrosis or perinephric stranding.  The stomach is physiologically distended. There are no dilated or thickened bowel loops. The terminal ileum is normal. Small volume of stool in the proximal colon, distal colon is decompressed. There is no pericolonic inflammatory change.  Abdominal aorta is normal in caliber. No retroperitoneal adenopathy.  Within the pelvis the bladder is physiologically distended. Prostate gland is normal. There is no pelvic adenopathy. No pelvic free fluid. Minimal fat in the right inguinal canal.  There are no acute or suspicious osseous abnormalities.  IMPRESSION: Dilated proximal appendix (14mm) containing intraluminal debris, with fluid distending the distal appendix. While there is no surrounding inflammatory change, findings are suspicious for very early acute appendicitis.   Electronically Signed   By: Rubye Oaks M.D.   On: 08/09/2014 21:26    Anti-infectives: Anti-infectives    Start     Dose/Rate Route Frequency Ordered Stop   08/10/14 0600  piperacillin-tazobactam (ZOSYN) IVPB 3.375 g     3.375 g 12.5 mL/hr over 240 Minutes Intravenous 3 times per day 08/10/14 0104     08/09/14 2200  Ampicillin-Sulbactam (UNASYN) 3 g in sodium chloride 0.9 % 100 mL IVPB     3 g 100 mL/hr over 60 Minutes Intravenous  Once 08/09/14 2131 08/09/14 2306      Assessment/Plan: s/p Procedure(s): APPENDECTOMY LAPAROSCOPIC (N/A) Urinary retention  Foley in place on flomax  D/c in am   On clears feels nauseated wants to stay on clears Home over the weekend  No ABX long term     Gerome Kokesh A. 08/11/2014

## 2014-08-11 NOTE — Progress Notes (Signed)
Patient stated that he had not voided since his surgery on 4/14. Patient stated that he did not feel any urge to urinate. Bladder scan was done at 0230 with 470cc in bladder. Patient got out of bed and walked in room to see if this would help him to urinate but with no results. Notified Dr. Leonard SchwartzB.Janee Mornhompson that patient had not voided since surgery, and he ordered for patient to take Flomax 0.4 mg now and then daily and also to insert foley catheter. Patient took the flomax but refused the foley catheter.  Another bladder scan was done at 0520 with 521cc in bladder.Patient was told the importance of having his bladder emptied and making sure he is able to go especially since he had not urinated since surgery. Patient stated that he understood but still refused  the foley catheter.

## 2014-08-11 NOTE — Progress Notes (Signed)
UR completed 

## 2014-08-12 NOTE — Progress Notes (Signed)
Discharge instructions/presciption given and explained to pt and pt's girlfriend.  Both verbalize understanding of all orders/instructions and deny any questions at this time.  IV removed by NT and site CDI.  VSS. Pt discharged to home with girlfriend in no s/s of distress. Sherald BargeSpencer, Kalyiah Saintil T

## 2014-08-12 NOTE — Discharge Instructions (Signed)

## 2014-08-12 NOTE — Progress Notes (Signed)
2 Days Post-Op  Subjective: Feels better. Able to pee and pass flatus  Objective: Vital signs in last 24 hours: Temp:  [98.4 F (36.9 C)-99.7 F (37.6 C)] 98.4 F (36.9 C) (04/15 2105) Pulse Rate:  [19-91] 19 (04/15 2105) Resp:  [16-19] 19 (04/15 2105) BP: (114-134)/(58-64) 114/64 mmHg (04/15 2105) SpO2:  [97 %-98 %] 97 % (04/15 2105) Last BM Date: 08/09/14  Intake/Output from previous day: 04/15 0701 - 04/16 0700 In: 2258 [P.O.:960; I.V.:1298] Out: 1950 [Urine:1950] Intake/Output this shift: Total I/O In: 1658 [P.O.:360; I.V.:1298] Out: 650 [Urine:650]  Resp: clear to auscultation bilaterally Cardio: regular rate and rhythm GI: soft, mild tenderness. good bs. incisions look good  Lab Results:   Recent Labs  08/10/14 0425 08/11/14 0442  WBC 20.9*  21.0* 10.6*  HGB 14.0  14.0 12.9*  HCT 40.9  40.3 38.5*  PLT 293  286 226   BMET  Recent Labs  08/09/14 1650 08/10/14 0425  NA 137  --   K 4.2  --   CL 103  --   CO2 25  --   GLUCOSE 109*  --   BUN 11  --   CREATININE 0.99 1.03  CALCIUM 9.7  --    PT/INR No results for input([name]): LABPROT, INR in the last 72 hours. ABG No results for input([name]): PHART, HCO3 in the last 72 hours.  Invalid input([name]): PCO2, PO2  Studies/Results: No results found.  Anti-infectives: Anti-infectives    Start     Dose/Rate Route Frequency Ordered Stop   08/10/14 0600  piperacillin-tazobactam (ZOSYN) IVPB 3.375 g  Status:  Discontinued     3.375 g 12.5 mL/hr over 240 Minutes Intravenous 3 times per day 08/10/14 0104 08/11/14 0924   08/09/14 2200  Ampicillin-Sulbactam (UNASYN) 3 g in sodium chloride 0.9 % 100 mL IVPB     3 g 100 mL/hr over 60 Minutes Intravenous  Once 08/09/14 2131 08/09/14 2306      Assessment/Plan: [name]/p Procedure([name]): APPENDECTOMY LAPAROSCOPIC (N/A) Discharge     Jeremiah [name],Jeremiah [name] 08/12/2014

## 2014-08-14 ENCOUNTER — Encounter (HOSPITAL_COMMUNITY): Payer: Self-pay | Admitting: Surgery

## 2014-08-28 NOTE — Discharge Summary (Signed)
  Physician Discharge Summary  Patient ID: Jeremiah [name] MRN: [id number] DOB/AGE: 1981/03/12 34 y.o.  Admit date: 08/09/2014 Discharge date: 4/16//2016  Admitting Diagnosis: Acute appendicitis   Discharge Diagnosis Patient Active Problem List   Diagnosis Date Noted  . Appendicitis, acute 08/09/2014  . Appendicitis 08/09/2014    Consultants none  Imaging: No results found.  Pro[facility]ures [facility]  [facility] Course:  Jeremiah ManisStephen Mcgath is a healthy 34 year old male who [facility].  Workup showed acute appendicitis.  Patient was admitted and underwent procedure listed above.  Tolerated procedure well and was transferred to the floor.  On POD#1 he had urinary retention treated with flomax.  Diet was advanced as tolerated.  On POD#2, the patient was voiding well, tolerating diet, ambulating well, pain well controlled, vital signs stable, incisions c/d/i and felt stable for discharge home.  Patient will follow up in our office in 3 weeks and knows to call with questions or concerns.      Medication List    TAKE these medications        GOODY HEADACHE PO  Take 1 Package by mouth every 6 (six) hours as needed (for pain).     omeprazole 20 MG capsule  Commonly known as:  PRILOSEC  Take 20 mg by mouth daily.             Follow-up Information    Follow up with [facility] On 08/29/2014.   Specialty:  General Surgery   Why:  arrive by 1:45PM for a 2:15PM post op check with advanced provider   Contact information:   [address] [place] [address] [phone]       Please note, I did not round on this patient on the day of discharge.  Signed: Ashok Norrismina Dunbar Buras, [facility]-BC [facility] [phone]  08/28/2014, 3:23 PM

## 2016-11-30 IMAGING — CT CT ABD-PELV W/ CM
2 of 4 series · 16 of 46 positions shown, 18 images · IV contrast (Omni 300)
Comparison: None.

CLINICAL DATA: Epigastric pain with nausea. Symptoms since this
morning, progressive.

EXAM:
CT ABDOMEN AND PELVIS WITH CONTRAST
TECHNIQUE: Multidetector CT imaging of the abdomen and pelvis was performed
using the standard protocol following bolus administration of
intravenous contrast.
CONTRAST:  100mL OMNIPAQUE IOHEXOL 300 MG/ML  SOLN

[Series 2: abd/ pelvis 5.0 i30f 1 · axial · 0.71mm/px · z∈[-524,-84]mm · 13 of 98 slices shown, 15 images]
[im 5/98  soft-tissue]
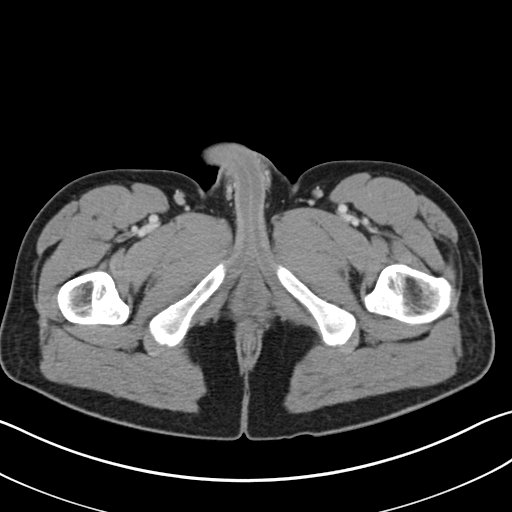
[im 5/98  bone]
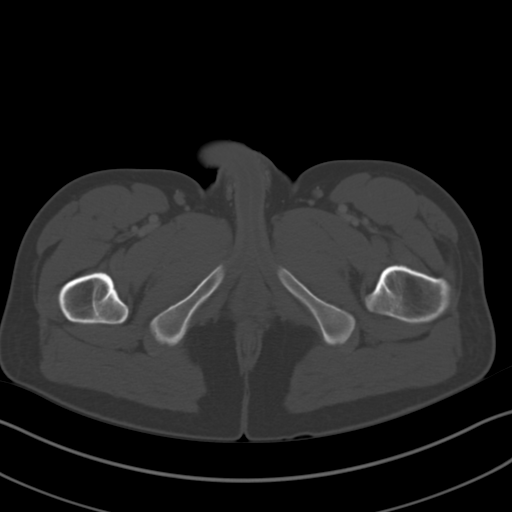
[im 13/98  soft-tissue]
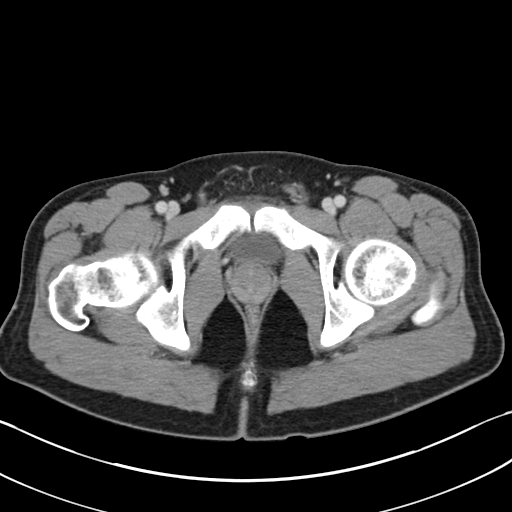
[im 22/98  soft-tissue]
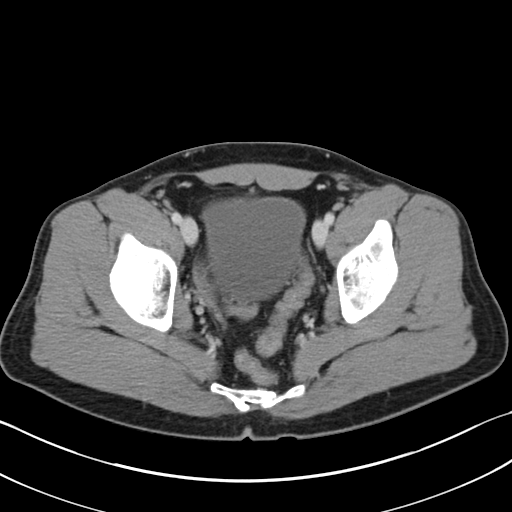
[im 26/98  soft-tissue]
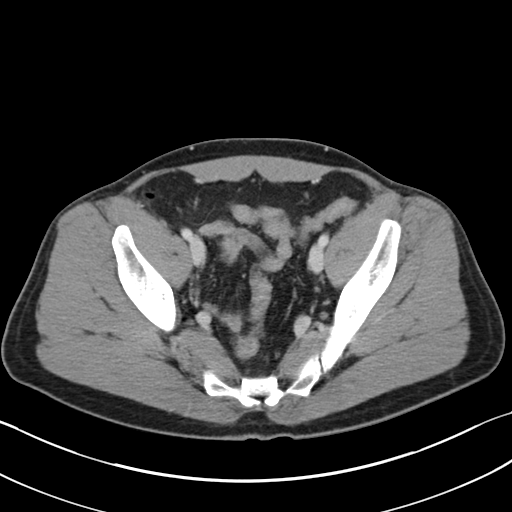
[im 34/98  soft-tissue]
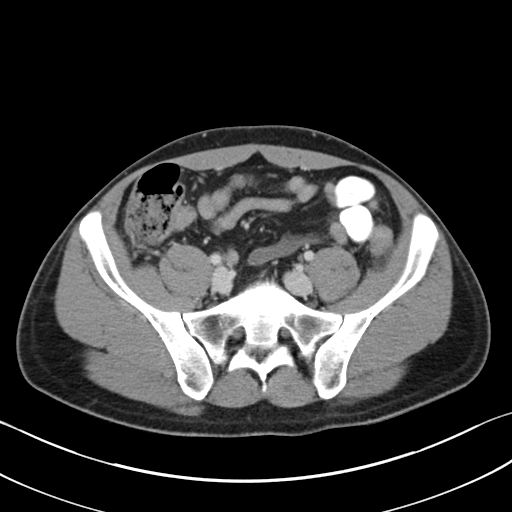
[im 43/98  soft-tissue]
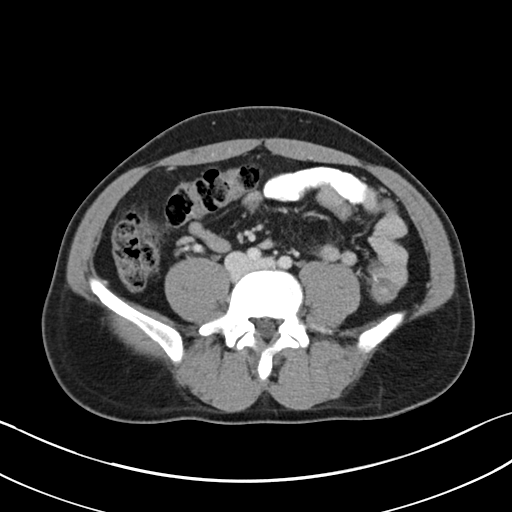
[im 51/98  soft-tissue]
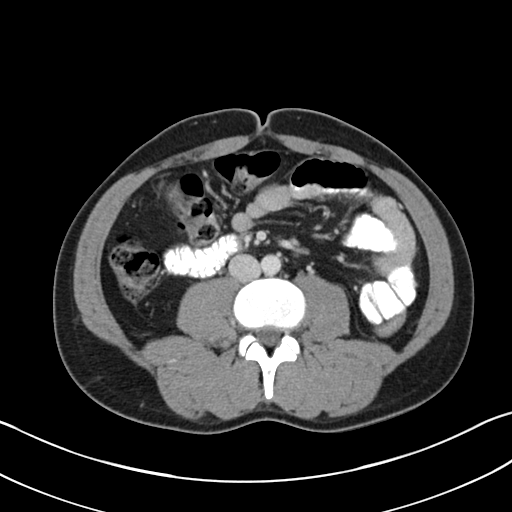
[im 55/98  soft-tissue]
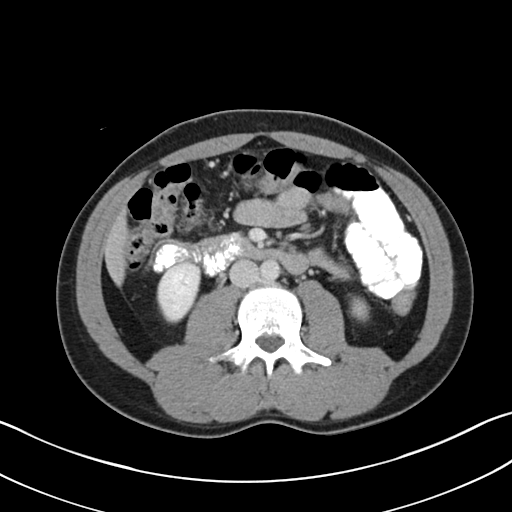
[im 64/98  soft-tissue]
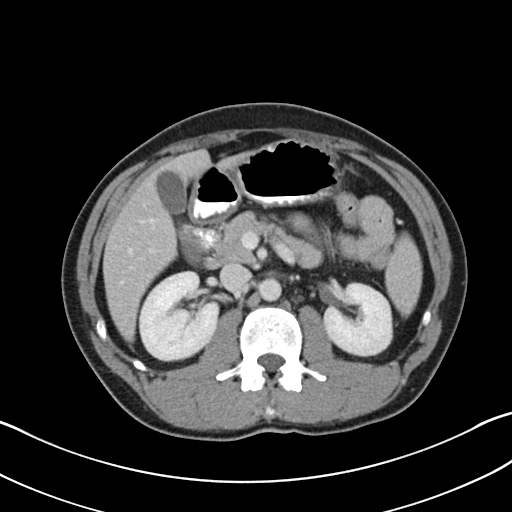
[im 64/98  bone]
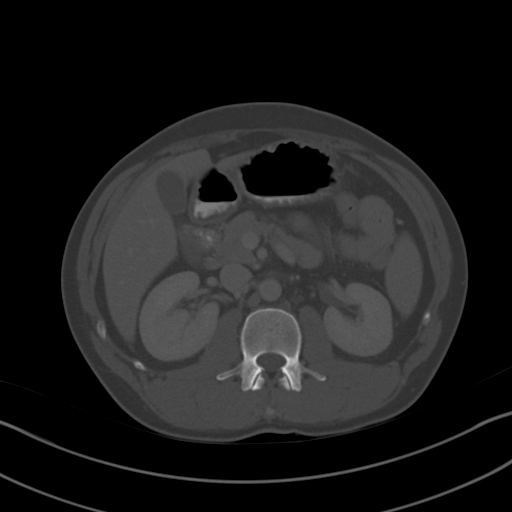
[im 72/98  soft-tissue]
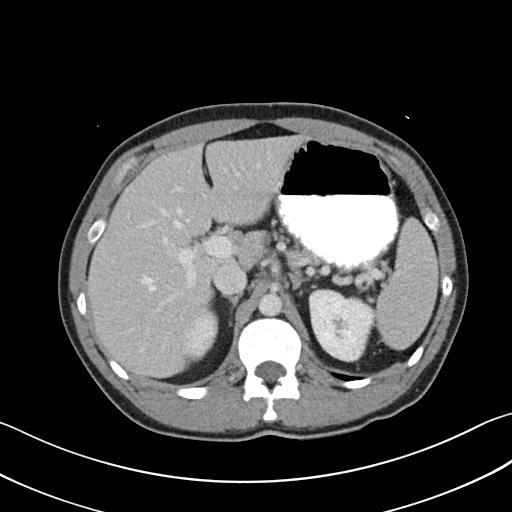
[im 76/98  soft-tissue]
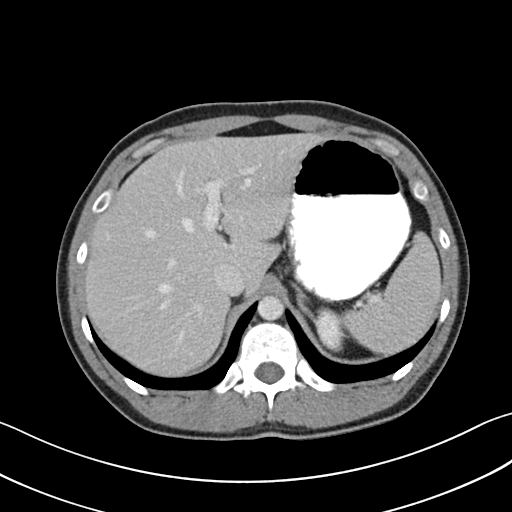
[im 85/98  soft-tissue]
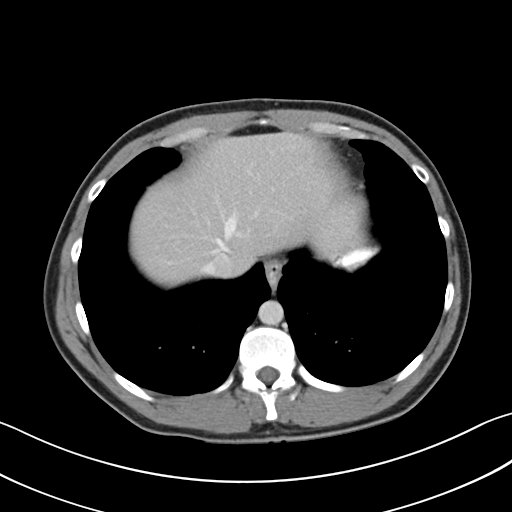
[im 93/98  soft-tissue]
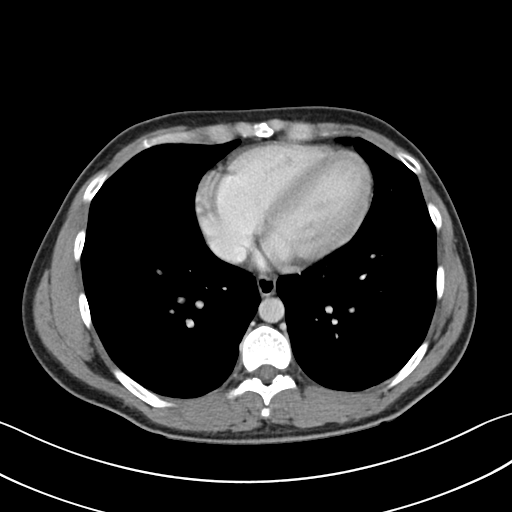

[Series 5: coronals · coronal · 0.70mm/px · 3 of 104 slices shown]
[im 35/104  soft-tissue]
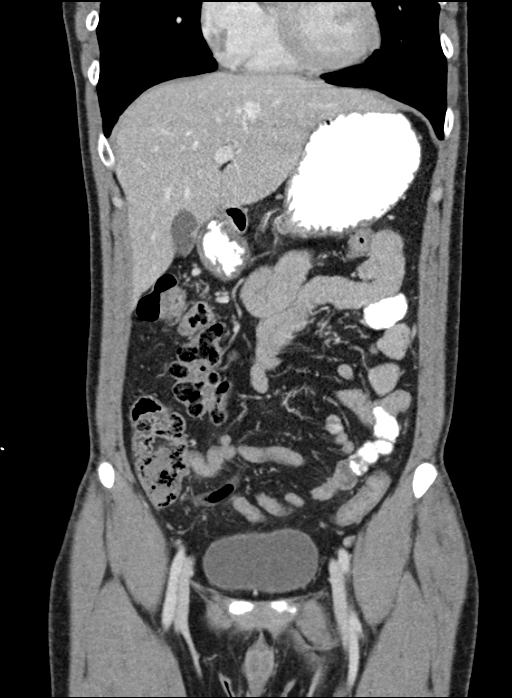
[im 46/104  soft-tissue]
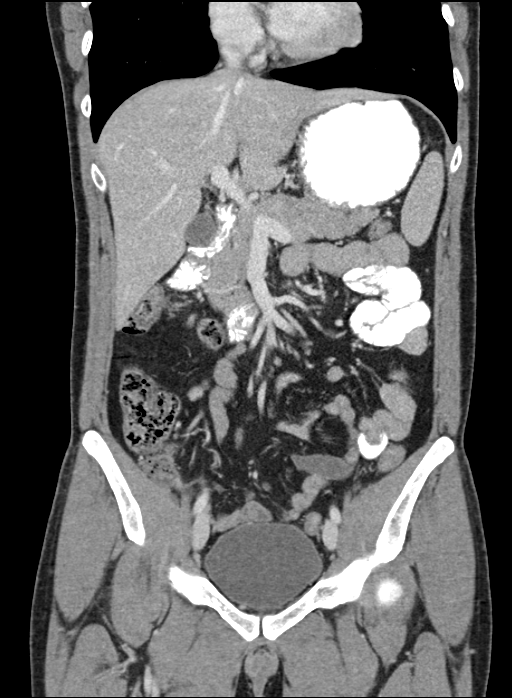
[im 58/104  soft-tissue]
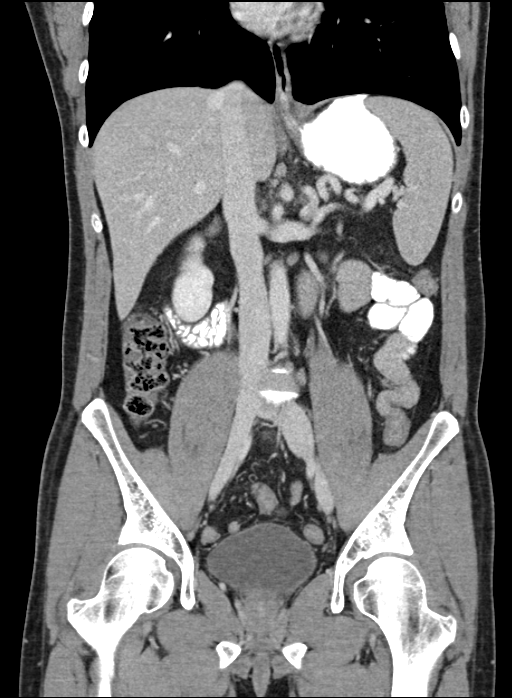

[16 of 46 positions shown; findings below may reference images not displayed]

FINDINGS: The included lung bases are clear.

The appendix is abnormal measuring 14 mm proximally and contains
intraluminal debris. There is air in the midportion of the appendix.
Distally the appendix is fluid-filled and measures 10 mm. There is
no definite surrounding periappendiceal inflammatory change.
Findings are suspicious for very early acute appendicitis. There is
no perforation or abscess.

The liver, gallbladder, spleen, adrenal glands, and pancreas are
normal. Small splenule noted at the splenic hilum. There is a small
cyst in the right kidney measuring 7 mm. Kidneys are otherwise
normal. There is symmetric renal enhancement without hydronephrosis
or perinephric stranding.

The stomach is physiologically distended. There are no dilated or
thickened bowel loops. The terminal ileum is normal. Small volume of
stool in the proximal colon, distal colon is decompressed. There is
no pericolonic inflammatory change.

Abdominal aorta is normal in caliber. No retroperitoneal adenopathy.

Within the pelvis the bladder is physiologically distended. Prostate
gland is normal. There is no pelvic adenopathy. No pelvic free
fluid. Minimal fat in the right inguinal canal.

There are no acute or suspicious osseous abnormalities.
IMPRESSION: Dilated proximal appendix (14mm) containing intraluminal debris,
with fluid distending the distal appendix. While there is no
surrounding inflammatory change, findings are suspicious for very
early acute appendicitis.
